# Patient Record
Sex: Female | Born: 1990 | Race: Black or African American | Hispanic: No | Marital: Single | Smoking: Never smoker
Health system: Southern US, Community
[De-identification: ages and names within clinical notes are randomized; demographics above are authoritative.]

---

## 2010-08-04 ENCOUNTER — Emergency Department (HOSPITAL_COMMUNITY): Admission: EM | Admit: 2010-08-04 | Discharge: 2010-04-07 | Payer: Self-pay | Admitting: Emergency Medicine
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
## 2015-12-22 ENCOUNTER — Emergency Department (HOSPITAL_COMMUNITY)
Admission: EM | Admit: 2015-12-22 | Discharge: 2015-12-22 | Disposition: A | Discharge status: Home / Self Care | Payer: Self-pay | Attending: Emergency Medicine | Admitting: Emergency Medicine

## 2015-12-22 ENCOUNTER — Encounter (HOSPITAL_COMMUNITY): Payer: Self-pay | Admitting: *Deleted

## 2015-12-22 DIAGNOSIS — Y9389 Activity, other specified: Secondary | ICD-10-CM | POA: Insufficient documentation

## 2015-12-22 DIAGNOSIS — Y9289 Other specified places as the place of occurrence of the external cause: Secondary | ICD-10-CM | POA: Insufficient documentation

## 2015-12-22 DIAGNOSIS — W500XXA Accidental hit or strike by another person, initial encounter: Secondary | ICD-10-CM | POA: Insufficient documentation

## 2015-12-22 DIAGNOSIS — S0990XA Unspecified injury of head, initial encounter: Secondary | ICD-10-CM | POA: Insufficient documentation

## 2015-12-22 DIAGNOSIS — S01511A Laceration without foreign body of lip, initial encounter: Secondary | ICD-10-CM | POA: Insufficient documentation

## 2015-12-22 DIAGNOSIS — Y998 Other external cause status: Secondary | ICD-10-CM | POA: Insufficient documentation

## 2015-12-22 MED ORDER — LIDOCAINE VISCOUS 2 % MT SOLN
15.0000 mL | Freq: Once | OROMUCOSAL | Status: AC
  Administered 2015-12-22: 15 mL via OROMUCOSAL
  Filled 2015-12-22: qty 15

## 2015-12-22 MED ORDER — LIDOCAINE-EPINEPHRINE 1 %-1:100000 IJ SOLN
10.0000 mL | Freq: Once | INTRAMUSCULAR | Status: AC
  Administered 2015-12-22: 10 mL
  Filled 2015-12-22: qty 1

## 2015-12-22 MED ORDER — TRAMADOL HCL 50 MG PO TABS: 50.0000 mg | ORAL_TABLET | Freq: Two times a day (BID) | ORAL | Status: AC | PRN

## 2015-12-22 NOTE — Discharge Instructions (Signed)
Mouth Laceration °A mouth laceration is a deep cut in the lining of your mouth (mucosa). The laceration may extend into your lip or go all of the way through your mouth and cheek. Lacerations inside your mouth may involve your tongue, the insides of your cheeks, or the upper surface of your mouth (palate). °Mouth lacerations may bleed a lot because your mouth has a very rich blood supply. Mouth lacerations may need to be repaired with stitches (sutures). °CAUSES °Any type of facial injury can cause a mouth laceration. Common causes include: °· Getting hit in the mouth. °· Being in a car accident. °SYMPTOMS °The most common sign of a mouth laceration is bleeding that fills the mouth. °DIAGNOSIS °Your health care provider can diagnose a mouth laceration by examining your mouth. Your mouth may need to be washed out (irrigated) with a sterile salt-water (saline) solution. Your health care provider may also have to remove any blood clots to determine how bad your injury is. You may need X-rays of the bones in your jaw or your face to rule out other injuries, such as dental injuries, facial fractures, or jaw fractures. °TREATMENT °Treatment depends on the location and severity of your injury. Small mouth lacerations may not need treatment if bleeding has stopped. You may need sutures if: °· You have a tongue laceration. °· Your mouth laceration is large or deep, or it continues to bleed. °If sutures are necessary, your health care provider will use absorbable sutures that dissolve as your body heals. You may also receive antibiotic medicine or a tetanus shot. °HOME CARE INSTRUCTIONS °· Take medicines only as directed by your health care provider. °· If you were prescribed an antibiotic medicine, finish all of it even if you start to feel better. °· Eat as directed by your health care provider. You may only be able to drink liquids or eat soft foods for a few days. °· Rinse your mouth with a warm, salt-water rinse 4-6  times per day or as directed by your health care provider. You can make a salt-water rinse by mixing one tsp of salt into two cups of warm water. °· Do not poke the sutures with your tongue. Doing that can loosen them. °· Check your wound every day for signs of infection. It is normal to have a white or gray patch over your wound while it heals. Watch for: °¨ Redness. °¨ Swelling. °¨ Blood or pus. °· Maintain regular oral hygiene, if possible. Gently brush your teeth with a soft, nylon-bristled toothbrush 2 times per day. °· Keep all follow-up visits as directed by your health care provider. This is important. °SEEK MEDICAL CARE IF: °· You were given a tetanus shot and have swelling, severe pain, redness, or bleeding at the injection site. °· You have a fever. °· Your pain is not controlled with medicine. °· You have redness, swelling, or pain at your wound that is getting worse. °· You have fresh bleeding or pus coming from your wound. °· The edges of your wound break open. °· You develop swollen, tender glands in your throat. °SEEK IMMEDIATE MEDICAL CARE IF:  °· Your face or the area under your jaw becomes swollen. °· You have trouble breathing or swallowing. °  °This information is not intended to replace advice given to you by your health care provider. Make sure you discuss any questions you have with your health care provider. °  °Document Released: 08/14/2005 Document Revised: 12/29/2014 Document Reviewed: 08/05/2014 °[employer]vier Interactive Patient   Education ©2016 [employer] ° °
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
